# Patient Record
Sex: Female | Born: 1965 | Race: White | Marital: Married
Health system: Southern US, Community
[De-identification: ages and names within clinical notes are randomized; demographics above are authoritative.]

---

## 2013-12-16 ENCOUNTER — Other Ambulatory Visit: Payer: Self-pay | Admitting: Family Medicine

## 2013-12-16 DIAGNOSIS — M5416 Radiculopathy, lumbar region: Secondary | ICD-10-CM

## 2013-12-19 ENCOUNTER — Other Ambulatory Visit: Payer: Self-pay

## 2013-12-23 ENCOUNTER — Ambulatory Visit
Admission: RE | Admit: 2013-12-23 | Discharge: 2013-12-23 | Disposition: A | Discharge status: Home / Self Care | Payer: Managed Care, Other (non HMO) | Source: Ambulatory Visit | Attending: Family Medicine | Admitting: Family Medicine

## 2013-12-23 DIAGNOSIS — M5416 Radiculopathy, lumbar region: Secondary | ICD-10-CM

## 2014-01-08 ENCOUNTER — Other Ambulatory Visit: Payer: Self-pay | Admitting: Obstetrics and Gynecology

## 2014-01-08 DIAGNOSIS — R928 Other abnormal and inconclusive findings on diagnostic imaging of breast: Secondary | ICD-10-CM

## 2014-01-22 ENCOUNTER — Ambulatory Visit
Admission: RE | Admit: 2014-01-22 | Discharge: 2014-01-22 | Disposition: A | Discharge status: Home / Self Care | Payer: Managed Care, Other (non HMO) | Source: Ambulatory Visit | Attending: Obstetrics and Gynecology | Admitting: Obstetrics and Gynecology

## 2014-01-22 ENCOUNTER — Encounter (INDEPENDENT_AMBULATORY_CARE_PROVIDER_SITE_OTHER): Payer: Self-pay

## 2014-01-22 DIAGNOSIS — R928 Other abnormal and inconclusive findings on diagnostic imaging of breast: Secondary | ICD-10-CM

## 2015-02-11 ENCOUNTER — Ambulatory Visit
Admission: RE | Admit: 2015-02-11 | Discharge: 2015-02-11 | Disposition: A | Discharge status: Home / Self Care | Payer: Managed Care, Other (non HMO) | Source: Ambulatory Visit | Attending: Family Medicine | Admitting: Family Medicine

## 2015-02-11 ENCOUNTER — Other Ambulatory Visit: Payer: Self-pay | Admitting: Family Medicine

## 2015-02-11 DIAGNOSIS — R590 Localized enlarged lymph nodes: Secondary | ICD-10-CM

## 2015-02-15 ENCOUNTER — Other Ambulatory Visit: Payer: Self-pay | Admitting: Family Medicine

## 2015-02-15 DIAGNOSIS — R590 Localized enlarged lymph nodes: Secondary | ICD-10-CM

## 2015-02-18 ENCOUNTER — Ambulatory Visit
Admission: RE | Admit: 2015-02-18 | Discharge: 2015-02-18 | Disposition: A | Discharge status: Home / Self Care | Payer: Managed Care, Other (non HMO) | Source: Ambulatory Visit | Attending: Family Medicine | Admitting: Family Medicine

## 2015-02-18 DIAGNOSIS — R590 Localized enlarged lymph nodes: Secondary | ICD-10-CM

## 2015-02-18 MED ORDER — IOPAMIDOL (ISOVUE-300) INJECTION 61%
75.0000 mL | Freq: Once | INTRAVENOUS | Status: AC | PRN
  Administered 2015-02-18: 75 mL via INTRAVENOUS

## 2015-02-23 ENCOUNTER — Other Ambulatory Visit: Payer: Self-pay | Admitting: Family Medicine

## 2015-02-23 DIAGNOSIS — R599 Enlarged lymph nodes, unspecified: Secondary | ICD-10-CM

## 2015-02-25 ENCOUNTER — Ambulatory Visit
Admission: RE | Admit: 2015-02-25 | Discharge: 2015-02-25 | Disposition: A | Discharge status: Home / Self Care | Payer: Managed Care, Other (non HMO) | Source: Ambulatory Visit | Attending: Family Medicine | Admitting: Family Medicine

## 2015-02-25 DIAGNOSIS — R599 Enlarged lymph nodes, unspecified: Secondary | ICD-10-CM

## 2015-02-25 MED ORDER — IOPAMIDOL (ISOVUE-300) INJECTION 61%
125.0000 mL | Freq: Once | INTRAVENOUS | Status: AC | PRN
  Administered 2015-02-25: 125 mL via INTRAVENOUS

## 2015-03-10 ENCOUNTER — Other Ambulatory Visit: Payer: Self-pay | Admitting: Surgery

## 2015-03-10 DIAGNOSIS — R591 Generalized enlarged lymph nodes: Secondary | ICD-10-CM

## 2015-03-10 DIAGNOSIS — R590 Localized enlarged lymph nodes: Secondary | ICD-10-CM

## 2015-03-10 DIAGNOSIS — R2231 Localized swelling, mass and lump, right upper limb: Secondary | ICD-10-CM

## 2015-03-17 ENCOUNTER — Ambulatory Visit
Admission: RE | Admit: 2015-03-17 | Discharge: 2015-03-17 | Disposition: A | Discharge status: Home / Self Care | Payer: Managed Care, Other (non HMO) | Source: Ambulatory Visit | Attending: Surgery | Admitting: Surgery

## 2015-03-17 DIAGNOSIS — R591 Generalized enlarged lymph nodes: Secondary | ICD-10-CM

## 2015-08-19 ENCOUNTER — Other Ambulatory Visit: Payer: Self-pay | Admitting: Surgery

## 2015-08-19 DIAGNOSIS — N6310 Unspecified lump in the right breast, unspecified quadrant: Secondary | ICD-10-CM

## 2015-09-16 ENCOUNTER — Other Ambulatory Visit: Payer: Self-pay

## 2015-09-16 ENCOUNTER — Other Ambulatory Visit: Payer: Self-pay | Admitting: Surgery

## 2015-09-16 DIAGNOSIS — N6310 Unspecified lump in the right breast, unspecified quadrant: Secondary | ICD-10-CM

## 2015-09-20 ENCOUNTER — Other Ambulatory Visit: Payer: Self-pay | Admitting: Surgery

## 2015-09-20 ENCOUNTER — Ambulatory Visit
Admission: RE | Admit: 2015-09-20 | Discharge: 2015-09-20 | Disposition: A | Discharge status: Home / Self Care | Payer: Managed Care, Other (non HMO) | Source: Ambulatory Visit | Attending: Surgery | Admitting: Surgery

## 2015-09-20 DIAGNOSIS — R2231 Localized swelling, mass and lump, right upper limb: Secondary | ICD-10-CM

## 2015-09-20 DIAGNOSIS — N6310 Unspecified lump in the right breast, unspecified quadrant: Secondary | ICD-10-CM

## 2015-10-13 ENCOUNTER — Other Ambulatory Visit: Payer: Self-pay | Admitting: Family Medicine

## 2015-10-13 ENCOUNTER — Other Ambulatory Visit: Payer: Self-pay | Admitting: Lactation Services

## 2015-10-13 DIAGNOSIS — R0789 Other chest pain: Secondary | ICD-10-CM

## 2015-10-13 DIAGNOSIS — R011 Cardiac murmur, unspecified: Secondary | ICD-10-CM

## 2015-10-14 ENCOUNTER — Ambulatory Visit
Admission: RE | Admit: 2015-10-14 | Discharge: 2015-10-14 | Disposition: A | Discharge status: Home / Self Care | Payer: Managed Care, Other (non HMO) | Source: Ambulatory Visit | Attending: Family Medicine | Admitting: Family Medicine

## 2015-10-14 DIAGNOSIS — R011 Cardiac murmur, unspecified: Secondary | ICD-10-CM

## 2015-10-14 DIAGNOSIS — R0789 Other chest pain: Secondary | ICD-10-CM

## 2015-10-14 MED ORDER — IOPAMIDOL (ISOVUE-370) INJECTION 76%
100.0000 mL | Freq: Once | INTRAVENOUS | Status: AC | PRN
  Administered 2015-10-14: 100 mL via INTRAVENOUS

## 2015-10-20 ENCOUNTER — Other Ambulatory Visit: Payer: Self-pay | Admitting: Family Medicine

## 2015-10-20 DIAGNOSIS — N63 Unspecified lump in unspecified breast: Secondary | ICD-10-CM

## 2015-10-21 ENCOUNTER — Other Ambulatory Visit: Payer: Self-pay | Admitting: Family Medicine

## 2015-10-25 ENCOUNTER — Other Ambulatory Visit: Payer: Self-pay | Admitting: Family Medicine

## 2015-10-25 DIAGNOSIS — N63 Unspecified lump in unspecified breast: Secondary | ICD-10-CM

## 2015-11-01 ENCOUNTER — Other Ambulatory Visit: Payer: Managed Care, Other (non HMO)

## 2015-11-07 ENCOUNTER — Ambulatory Visit
Admission: RE | Admit: 2015-11-07 | Discharge: 2015-11-07 | Disposition: A | Discharge status: Home / Self Care | Payer: Managed Care, Other (non HMO) | Source: Ambulatory Visit | Attending: Family Medicine | Admitting: Family Medicine

## 2015-11-07 ENCOUNTER — Other Ambulatory Visit: Payer: Self-pay | Admitting: Family Medicine

## 2015-11-07 DIAGNOSIS — N63 Unspecified lump in unspecified breast: Secondary | ICD-10-CM

## 2015-11-17 ENCOUNTER — Ambulatory Visit
Admission: RE | Admit: 2015-11-17 | Discharge: 2015-11-17 | Disposition: A | Discharge status: Home / Self Care | Payer: Managed Care, Other (non HMO) | Source: Ambulatory Visit | Attending: Family Medicine | Admitting: Family Medicine

## 2015-11-17 DIAGNOSIS — N63 Unspecified lump in unspecified breast: Secondary | ICD-10-CM

## 2016-05-28 ENCOUNTER — Other Ambulatory Visit: Payer: Self-pay | Admitting: Family Medicine

## 2016-05-28 DIAGNOSIS — Z1231 Encounter for screening mammogram for malignant neoplasm of breast: Secondary | ICD-10-CM

## 2016-06-14 ENCOUNTER — Ambulatory Visit
Admission: RE | Admit: 2016-06-14 | Discharge: 2016-06-14 | Disposition: A | Discharge status: Home / Self Care | Payer: 59 | Source: Ambulatory Visit | Attending: Family Medicine | Admitting: Family Medicine

## 2016-06-14 DIAGNOSIS — Z1231 Encounter for screening mammogram for malignant neoplasm of breast: Secondary | ICD-10-CM

## 2016-06-18 ENCOUNTER — Other Ambulatory Visit: Payer: Self-pay | Admitting: Family Medicine

## 2016-06-18 DIAGNOSIS — R928 Other abnormal and inconclusive findings on diagnostic imaging of breast: Secondary | ICD-10-CM

## 2016-06-21 ENCOUNTER — Ambulatory Visit
Admission: RE | Admit: 2016-06-21 | Discharge: 2016-06-21 | Disposition: A | Discharge status: Home / Self Care | Payer: 59 | Source: Ambulatory Visit | Attending: Family Medicine | Admitting: Family Medicine

## 2016-06-21 DIAGNOSIS — R928 Other abnormal and inconclusive findings on diagnostic imaging of breast: Secondary | ICD-10-CM

## 2016-06-29 ENCOUNTER — Other Ambulatory Visit: Payer: Self-pay | Admitting: Obstetrics and Gynecology

## 2016-07-02 LAB — CYTOLOGY - PAP

## 2017-03-04 IMAGING — CR DG CHEST 2V
2 series · 2 of 2 positions shown · non-contrast
Comparison: None.

CLINICAL DATA: Left cervical adenopathy

EXAM:
CHEST  2 VIEW

[w chest pa]
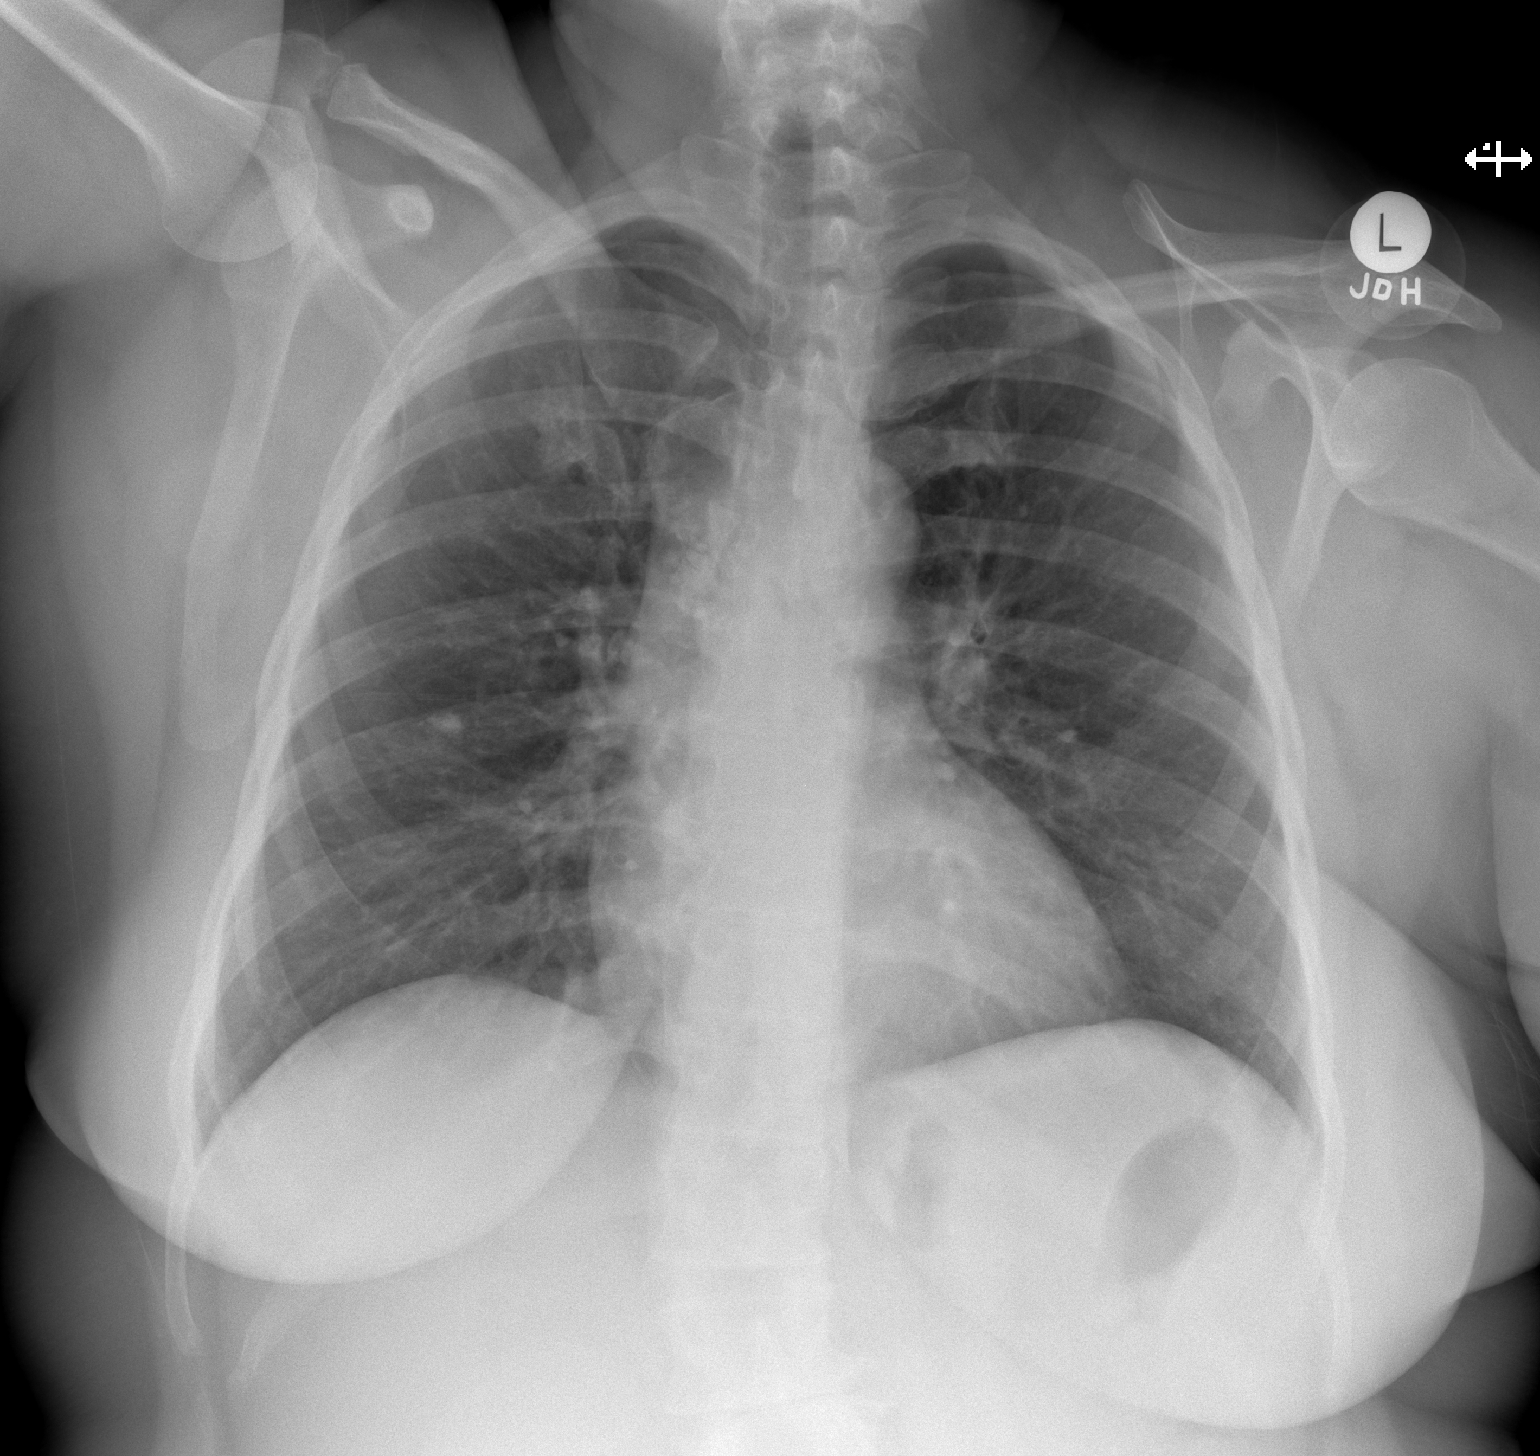

[w chest lat]
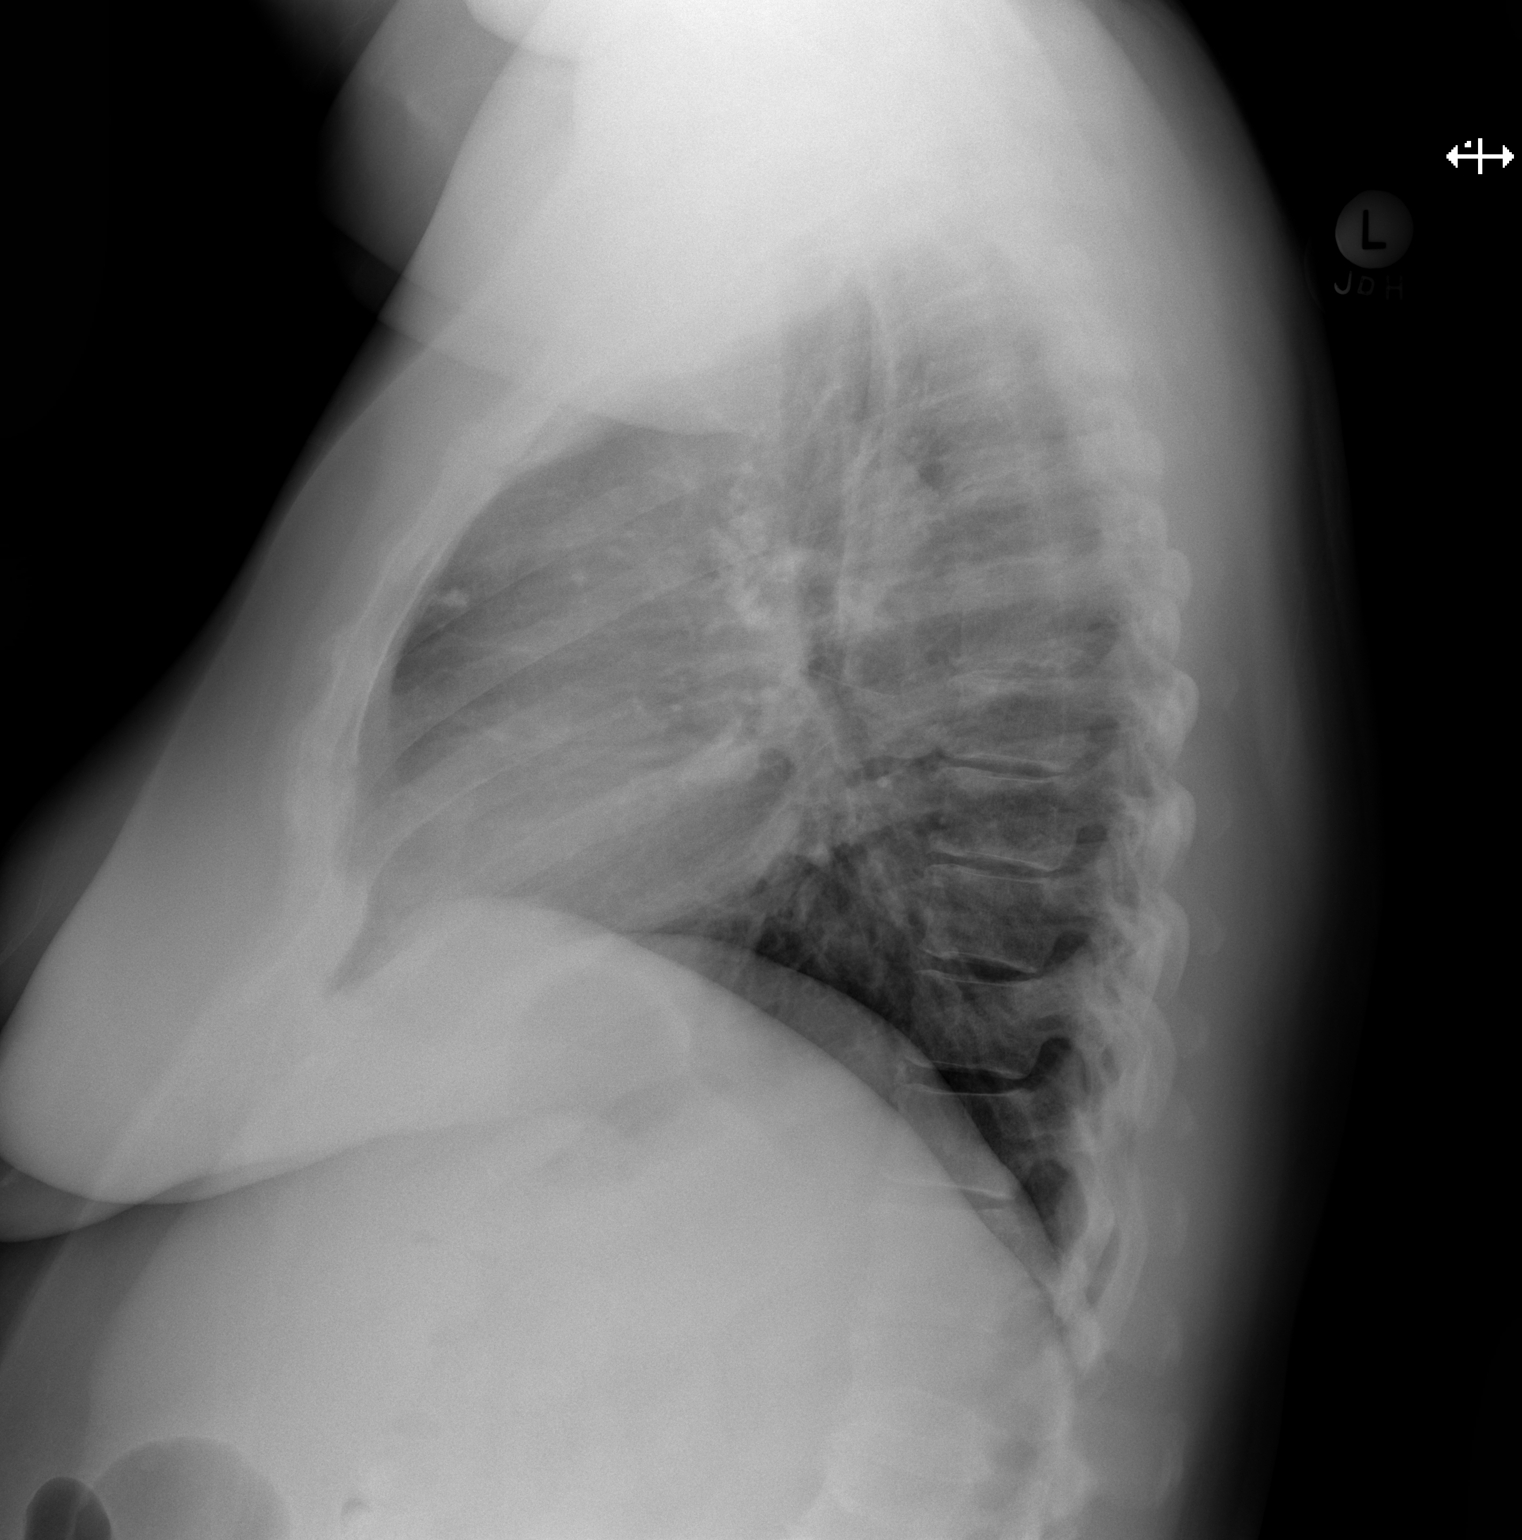

[2 of 2 positions shown; findings below may reference images not displayed]

FINDINGS: Cardiomediastinal silhouette is unremarkable. No acute infiltrate or
pleural effusion. No pulmonary edema. Mild degenerative changes
right AC joint. There is a calcified granuloma right midlung
measures 7 mm. Mild degenerative changes mid thoracic spine.
IMPRESSION: No active disease.  Calcified granuloma right midlung measures 7 mm.

## 2017-03-11 IMAGING — CT CT NECK W/ CM
4 of 6 series · 14 of 33 positions shown, 16 images · IV contrast (iopamidol)
Comparison: None.

CLINICAL DATA: Cervical lymphadenopathy on the left. Symptoms
scratch head palpable areas for 2-3 weeks. No tenderness or pain.

EXAM:
CT NECK WITH CONTRAST
TECHNIQUE: Multidetector CT imaging of the neck was performed using the
standard protocol following the bolus administration of intravenous
contrast.
CONTRAST:  75mL [id number] IOPAMIDOL (U103EC-SXX) INJECTION 61%

[Series 4: neck 3.0 spo cor · coronal · 0.39mm/px · 3 of 54 slices shown]
[im 11/54  bone]
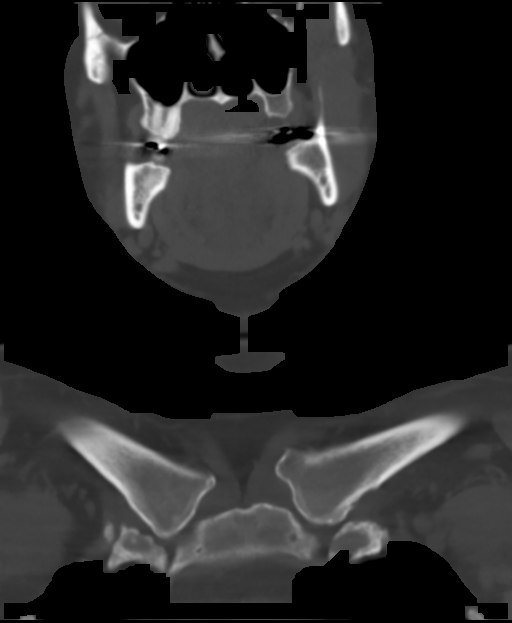
[im 22/54  bone]
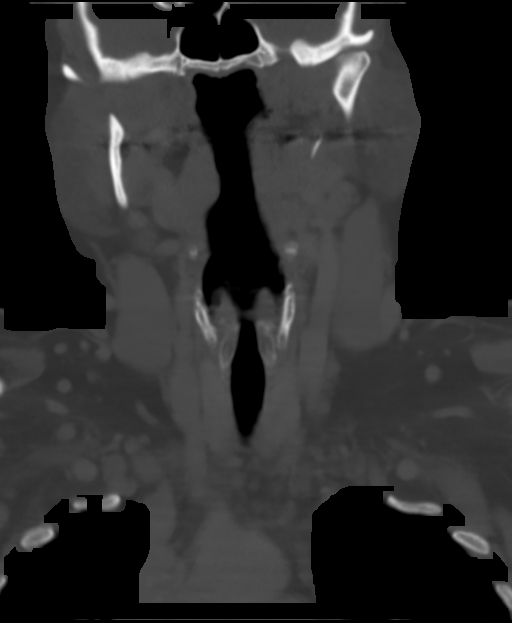
[im 32/54  bone]
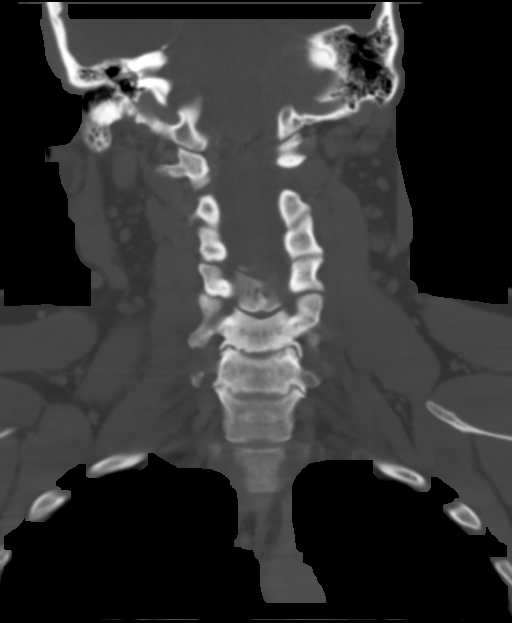

[Series 5: neck 3.0 spo sag · sagittal · 0.40mm/px · 5 of 67 slices shown, 6 images]
[im 23/67  bone]
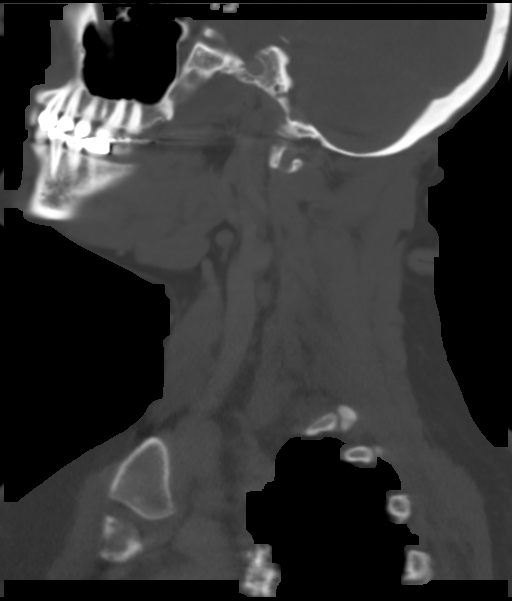
[im 28/67  bone]
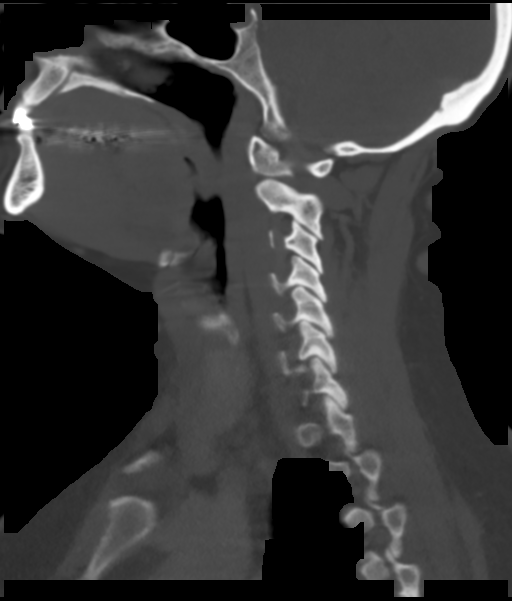
[im 34/67  soft-tissue]
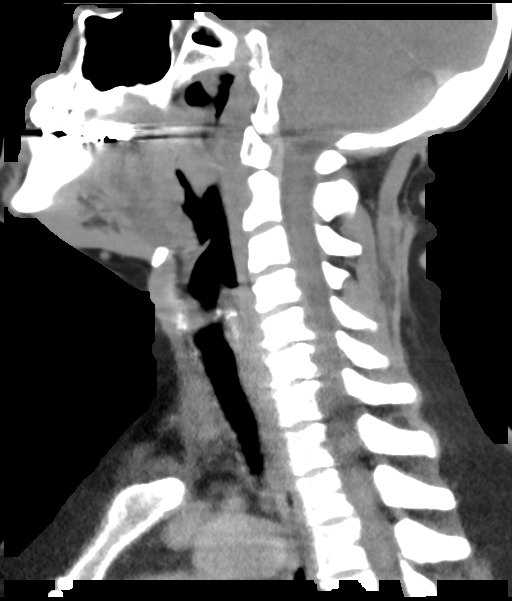
[im 34/67  bone]
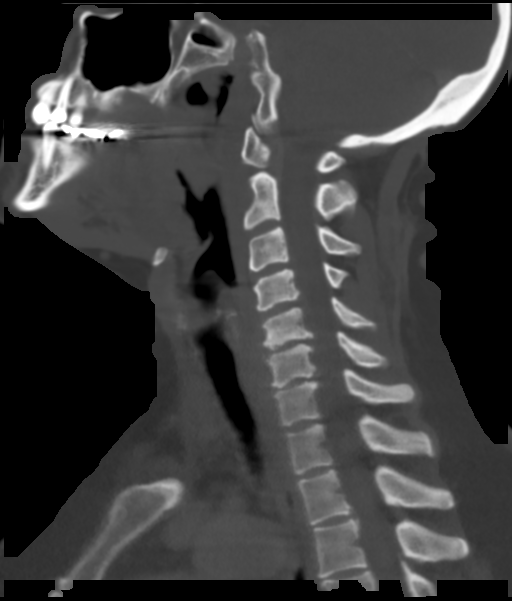
[im 39/67  bone]
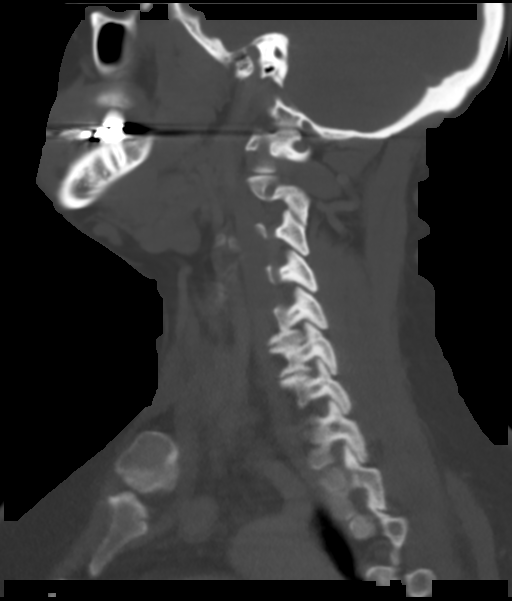
[im 45/67  bone]
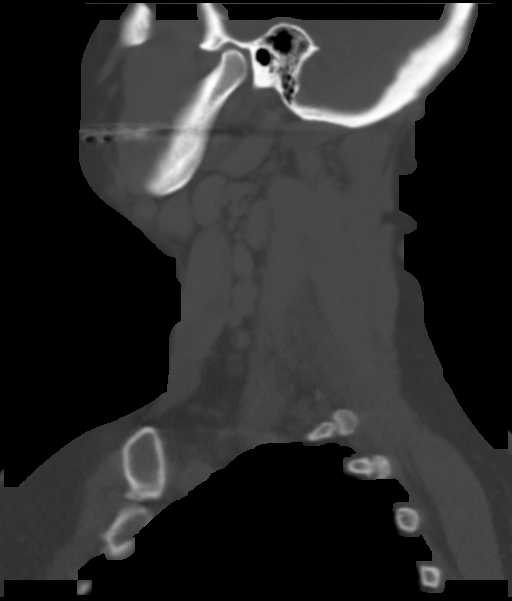

[Series 6: neck 3.0 spo · axial · 0.34mm/px · z∈[-196,-116]mm · 2 of 81 slices shown]
[im 27/81  bone]
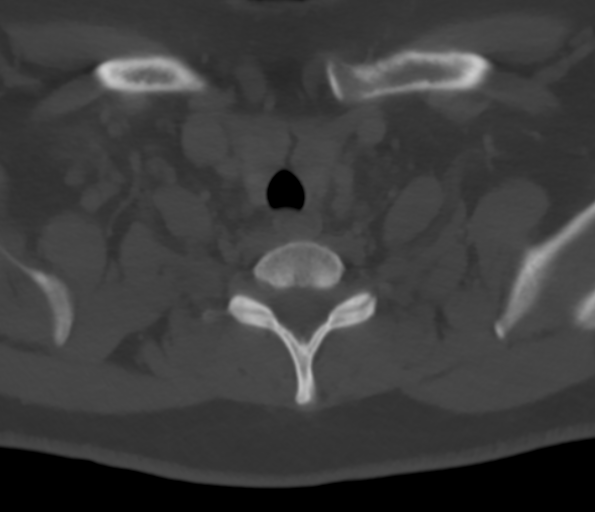
[im 54/81  bone]
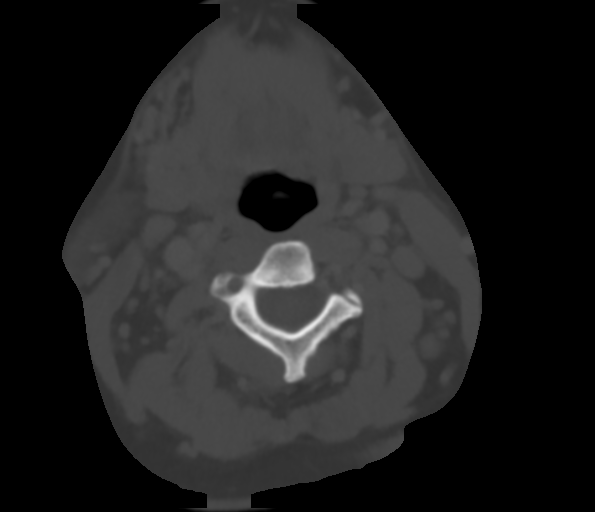

[Series 602: angled axial · axial · 0.47mm/px · z∈[-246,-101]mm · 4 of 127 slices shown, 5 images]
[im 26/127  soft-tissue]
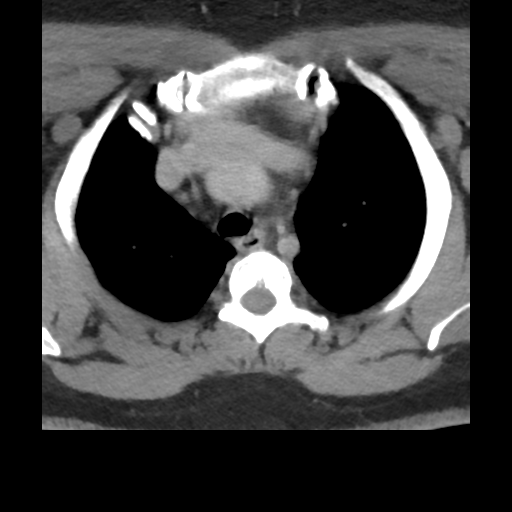
[im 26/127  bone]
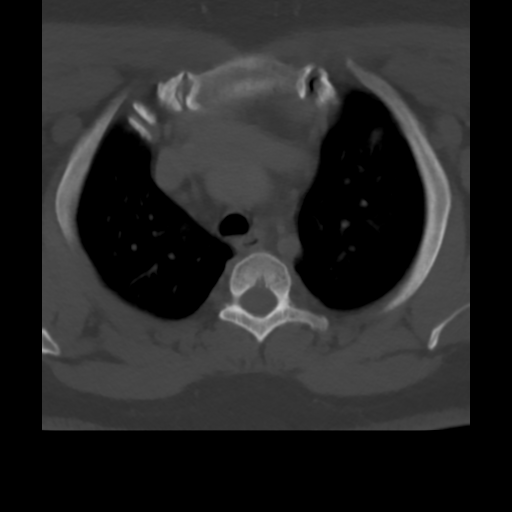
[im 51/127  bone]
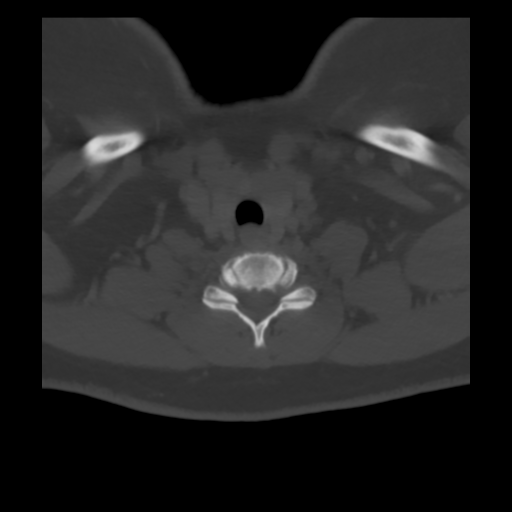
[im 76/127  bone]
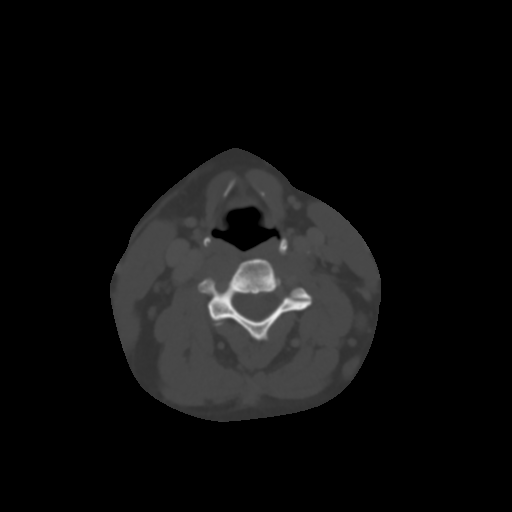
[im 101/127  bone]
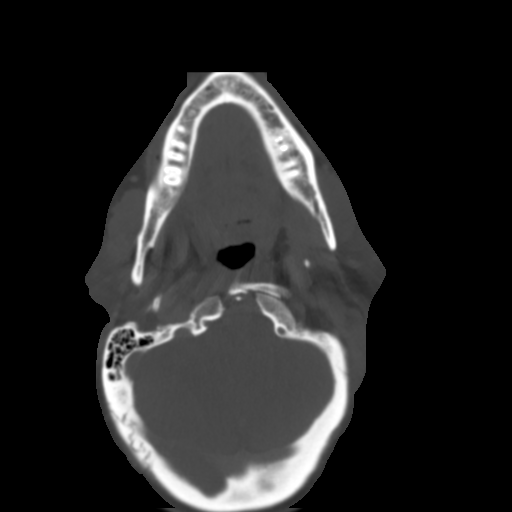

[14 of 33 positions shown; findings below may reference images not displayed]

FINDINGS: Pharynx and larynx: Unremarkable.

Salivary glands: Unremarkable.

Thyroid: Unremarkable.

Lymph nodes: Enlarged left cervical and axillary lymph nodes. Index
left level 2 node measures 10 mm short axis diameter on image 28.
Posterior chain lymph node on image 31 measures 11 mm in short axis
diameter. Lower cervical lymph node on image 40 measures 9 mm in
short axis diameter. Left axillary lymph node on image 68 has a
short axis diameter of 18 mm with a other enlarged left axillary and
nodes partially imaged. No right cervical adenopathy. Small
scattered right cervical lymph nodes are not pathologically
enlarged.

Vascular: Vessels widely patent.

Limited intracranial: Unremarkable.

Visualized orbits: Unremarkable.

Mastoids and visualized paranasal sinuses: Clear.

Skeleton: No acute bony abnormality.

Upper chest: Lung apices clear.

Other: None
IMPRESSION: Left cervical and axillary lymphadenopathy. Cannot exclude lymphoma
or metastatic disease. No other abnormality noted.
